# Patient Record
Sex: Female | Born: 2005 | Race: Black or African American | Hispanic: No | Marital: Single | Smoking: Never smoker
Health system: Southern US, Community
[De-identification: ages and names within clinical notes are randomized; demographics above are authoritative.]

## PROBLEM LIST (undated history)

## (undated) DIAGNOSIS — F419 Anxiety disorder, unspecified: Secondary | ICD-10-CM

## (undated) DIAGNOSIS — J45909 Unspecified asthma, uncomplicated: Secondary | ICD-10-CM

## (undated) DIAGNOSIS — D649 Anemia, unspecified: Secondary | ICD-10-CM

## (undated) HISTORY — DX: Unspecified asthma, uncomplicated: J45.909

## (undated) HISTORY — DX: Anxiety disorder, unspecified: F41.9

## (undated) HISTORY — DX: Anemia, unspecified: D64.9

---
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
## 2023-12-18 ENCOUNTER — Telehealth: Payer: Self-pay | Admitting: Pediatrics

## 2023-12-18 NOTE — Telephone Encounter (Signed)
I called patient and got them scheduled with Ricky Stabs. Amy stated that prozac was fine, se will evaluated patient and be able to continue prescription.

## 2023-12-18 NOTE — Telephone Encounter (Signed)
NOTED

## 2023-12-18 NOTE — Telephone Encounter (Signed)
Copied from CRM [id number]. Topic: General - Other >> Dec 18, 2023  8:06 AM Tiffany B wrote: Caller checking on the status of practice admin following up regarding if provider will prescribe prozac and if not she will schedule appt. with behavior health.

## 2024-02-26 ENCOUNTER — Ambulatory Visit: Payer: BC Managed Care – PPO | Admitting: Family Medicine

## 2024-03-01 DIAGNOSIS — M546 Pain in thoracic spine: Secondary | ICD-10-CM | POA: Diagnosis not present

## 2024-03-01 DIAGNOSIS — M545 Low back pain, unspecified: Secondary | ICD-10-CM | POA: Diagnosis not present

## 2024-03-02 ENCOUNTER — Ambulatory Visit (INDEPENDENT_AMBULATORY_CARE_PROVIDER_SITE_OTHER): Payer: BC Managed Care – PPO | Admitting: Family

## 2024-03-02 ENCOUNTER — Encounter: Payer: Self-pay | Admitting: Family

## 2024-03-02 VITALS — BP 112/73 | HR 89 | Temp 98.4°F | Ht 63.5 in | Wt 193.8 lb

## 2024-03-02 DIAGNOSIS — F419 Anxiety disorder, unspecified: Secondary | ICD-10-CM | POA: Diagnosis not present

## 2024-03-02 DIAGNOSIS — Z131 Encounter for screening for diabetes mellitus: Secondary | ICD-10-CM | POA: Diagnosis not present

## 2024-03-02 DIAGNOSIS — Z13220 Encounter for screening for lipoid disorders: Secondary | ICD-10-CM

## 2024-03-02 DIAGNOSIS — J45990 Exercise induced bronchospasm: Secondary | ICD-10-CM | POA: Diagnosis not present

## 2024-03-02 DIAGNOSIS — Z13228 Encounter for screening for other metabolic disorders: Secondary | ICD-10-CM

## 2024-03-02 DIAGNOSIS — Z0000 Encounter for general adult medical examination without abnormal findings: Secondary | ICD-10-CM

## 2024-03-02 DIAGNOSIS — Z130 Encounter for screening for diseases of the blood and blood-forming organs and certain disorders involving the immune mechanism: Secondary | ICD-10-CM | POA: Diagnosis not present

## 2024-03-02 DIAGNOSIS — Z1159 Encounter for screening for other viral diseases: Secondary | ICD-10-CM

## 2024-03-02 DIAGNOSIS — F32A Depression, unspecified: Secondary | ICD-10-CM

## 2024-03-02 DIAGNOSIS — Z114 Encounter for screening for human immunodeficiency virus [HIV]: Secondary | ICD-10-CM | POA: Diagnosis not present

## 2024-03-02 DIAGNOSIS — Z7689 Persons encountering health services in other specified circumstances: Secondary | ICD-10-CM

## 2024-03-02 DIAGNOSIS — Z1329 Encounter for screening for other suspected endocrine disorder: Secondary | ICD-10-CM

## 2024-03-02 MED ORDER — ALBUTEROL SULFATE HFA 108 (90 BASE) MCG/ACT IN AERS: 2.0000 | INHALATION_SPRAY | Freq: Four times a day (QID) | RESPIRATORY_TRACT | 1 refills | Status: AC | PRN

## 2024-03-02 MED ORDER — FLUOXETINE HCL 20 MG PO TABS: 20.0000 mg | ORAL_TABLET | Freq: Every day | ORAL | 1 refills | Status: DC

## 2024-03-02 MED ORDER — FLUOXETINE HCL 10 MG PO TABS: 10.0000 mg | ORAL_TABLET | Freq: Every day | ORAL | 1 refills | Status: DC

## 2024-03-02 NOTE — Progress Notes (Signed)
 Subjective:    Kelli Stevens - 18 y.o. female MRN [id number]  Date of birth: 05-Sep-2006  HPI  Kelli Stevens is to establish care and annual physical exam. She is accompanied by her mother.  Current issues and/or concerns: - Anxiety depression. Doing well on Fluoxetine 20 mg and Fluoxetine 10 mg daily at bedtime, no issues/concerns. Mother states patient needs referral to Psychiatry for evaluation/management. Patient states she is concerned for additional diagnoses. Patient denies thoughts of self-harm, suicidal ideations, homicidal ideations. - Exercise-induced asthma. Doing well on ProAir Albuterol inhaler, no issues/concerns.  - No further issues/concerns for discussion today.  ROS per HPI     Health Maintenance:  Health Maintenance Due  Topic Date Due   HPV VACCINES (1 - 3-dose series) Never done   HIV Screening  Never done   Hepatitis C Screening  Never done   DTaP/Tdap/Td (1 - Tdap) Never done   COVID-19 Vaccine (1 - 2024-25 season) Never done     Past Medical History: There are no active problems to display for this patient.     Social History   reports that she has never smoked. She has never used smokeless tobacco. She reports that she does not drink alcohol.   Family History  family history is not on file.   Medications: reviewed and updated   Objective:   Physical Exam BP 112/73   Pulse 89   Temp 98.4 F (36.9 C) (Oral)   Ht 5' 3.5" (1.613 m)   Wt 193 lb 12.8 oz (87.9 kg)   LMP 03/01/2024 (Exact Date)   SpO2 98%   BMI 33.79 kg/m   Physical Exam HENT:     Head: Normocephalic and atraumatic.     Right Ear: Tympanic membrane, ear canal and external ear normal.     Left Ear: Tympanic membrane, ear canal and external ear normal.     Nose: Nose normal.     Mouth/Throat:     Mouth: Mucous membranes are moist.     Pharynx: Oropharynx is clear.  Eyes:     Extraocular Movements: Extraocular movements intact.     Conjunctiva/sclera: Conjunctivae normal.      Pupils: Pupils are equal, round, and reactive to light.  Neck:     Thyroid: No thyroid mass, thyromegaly or thyroid tenderness.  Cardiovascular:     Rate and Rhythm: Normal rate and regular rhythm.     Pulses: Normal pulses.     Heart sounds: Normal heart sounds.  Pulmonary:     Effort: Pulmonary effort is normal.     Breath sounds: Normal breath sounds.  Chest:     Comments: Patient declined. Abdominal:     General: Bowel sounds are normal.     Palpations: Abdomen is soft.  Genitourinary:    Comments: Patient declined. Musculoskeletal:        General: Normal range of motion.     Right shoulder: Normal.     Left shoulder: Normal.     Right upper arm: Normal.     Left upper arm: Normal.     Right elbow: Normal.     Left elbow: Normal.     Right forearm: Normal.     Left forearm: Normal.     Right wrist: Normal.     Left wrist: Normal.     Right hand: Normal.     Left hand: Normal.     Cervical back: Normal, normal range of motion and neck supple.     Thoracic back: Normal.  Lumbar back: Normal.     Right hip: Normal.     Left hip: Normal.     Right upper leg: Normal.     Left upper leg: Normal.     Right knee: Normal.     Left knee: Normal.     Right lower leg: Normal.     Left lower leg: Normal.     Right ankle: Normal.     Left ankle: Normal.     Right foot: Normal.     Left foot: Normal.  Skin:    General: Skin is warm and dry.     Capillary Refill: Capillary refill takes less than 2 seconds.  Neurological:     General: No focal deficit present.     Mental Status: She is alert and oriented to person, place, and time.  Psychiatric:        Mood and Affect: Mood normal.        Behavior: Behavior normal.       Assessment & Plan:  1. Encounter to establish care (Primary) 2. Annual physical exam - Counseled on 150 minutes of exercise per week as tolerated, healthy eating (including decreased daily intake of saturated fats, cholesterol, added sugars,  sodium), STI prevention, and routine healthcare maintenance.  3. Screening for metabolic disorder - Routine screening.  - CMP14+EGFR  4. Screening for deficiency anemia - Routine screening.  - CBC  5. Diabetes mellitus screening - Routine screening.  - Hemoglobin A1c  6. Screening cholesterol level - Routine screening.  - Lipid panel  7. Thyroid disorder screen - Routine screening.  - TSH  8. Encounter for screening for HIV - Routine screening.  - HIV antibody (with reflex)  9. Need for hepatitis C screening test - Routine screening.  - Hepatitis C Antibody  10. Anxiety and depression - Patient denies thoughts of self-harm, suicidal ideations, homicidal ideations. - Continue Fluoxetine as prescribed. Counseled on medication adherence/adverse effects. - Referral to Psychiatry for evaluation/management.  - Follow-up with primary provider as scheduled. - FLUoxetine (PROZAC) 10 MG tablet; Take 1 tablet (10 mg total) by mouth daily.  Dispense: 30 tablet; Refill: 1 - FLUoxetine (PROZAC) 20 MG tablet; Take 1 tablet (20 mg total) by mouth daily.  Dispense: 30 tablet; Refill: 1 - Ambulatory referral to Psychiatry  11. Exercise-induced asthma - Continue Albuterol inhaler as prescribed. Counseled on medication adherence/adverse effects.  - Follow-up with primary provider as scheduled. - albuterol (VENTOLIN HFA) 108 (90 Base) MCG/ACT inhaler; Inhale 2 puffs into the lungs every 6 (six) hours as needed for wheezing or shortness of breath.  Dispense: 16 g; Refill: 1    Patient was given clear instructions to go to Emergency Department or return to medical center if symptoms don't improve, worsen, or new problems develop.The patient verbalized understanding.  I discussed the assessment and treatment plan with the patient. The patient was provided an opportunity to ask questions and all were answered. The patient agreed with the plan and demonstrated an understanding of the  instructions.   The patient was advised to call back or seek an in-person evaluation if the symptoms worsen or if the condition fails to improve as anticipated.    Ricky Stabs, NP 03/02/2024, 4:01 PM Primary Care at [facility]

## 2024-03-02 NOTE — Progress Notes (Signed)
 Patient wants referral for mental health Dr.

## 2024-03-03 ENCOUNTER — Other Ambulatory Visit: Payer: Self-pay | Admitting: Family

## 2024-03-03 DIAGNOSIS — Z130 Encounter for screening for diseases of the blood and blood-forming organs and certain disorders involving the immune mechanism: Secondary | ICD-10-CM

## 2024-03-03 LAB — CBC
Hematocrit: 41 % (ref 34.0–46.6)
Hemoglobin: 13.6 g/dL (ref 11.1–15.9)
MCH: 28.5 pg (ref 26.6–33.0)
MCHC: 33.2 g/dL (ref 31.5–35.7)
MCV: 86 fL (ref 79–97)
Platelets: 366 10*3/uL (ref 150–450)
RBC: 4.78 x10E6/uL (ref 3.77–5.28)
RDW: 13.4 % (ref 11.7–15.4)
WBC: 13.4 10*3/uL — ABNORMAL HIGH (ref 3.4–10.8)

## 2024-03-03 LAB — CMP14+EGFR
ALT: 15 IU/L (ref 0–32)
AST: 17 IU/L (ref 0–40)
Albumin: 4.5 g/dL (ref 4.0–5.0)
Alkaline Phosphatase: 119 IU/L — ABNORMAL HIGH (ref 42–106)
BUN/Creatinine Ratio: 12 (ref 9–23)
BUN: 10 mg/dL (ref 6–20)
Bilirubin Total: 0.3 mg/dL (ref 0.0–1.2)
CO2: 19 mmol/L — ABNORMAL LOW (ref 20–29)
Calcium: 9.4 mg/dL (ref 8.7–10.2)
Chloride: 103 mmol/L (ref 96–106)
Creatinine, Ser: 0.82 mg/dL (ref 0.57–1.00)
Globulin, Total: 3 g/dL (ref 1.5–4.5)
Glucose: 102 mg/dL — ABNORMAL HIGH (ref 70–99)
Potassium: 3.8 mmol/L (ref 3.5–5.2)
Sodium: 139 mmol/L (ref 134–144)
Total Protein: 7.5 g/dL (ref 6.0–8.5)
eGFR: 106 mL/min/{1.73_m2} (ref >59)

## 2024-03-03 LAB — HEPATITIS C ANTIBODY: Hep C Virus Ab: NONREACTIVE

## 2024-03-03 LAB — LIPID PANEL
Chol/HDL Ratio: 2.5 ratio (ref 0.0–4.4)
Cholesterol, Total: 126 mg/dL (ref 100–169)
HDL: 51 mg/dL (ref >39)
LDL Chol Calc (NIH): 61 mg/dL (ref 0–109)
Triglycerides: 67 mg/dL (ref 0–89)
VLDL Cholesterol Cal: 14 mg/dL (ref 5–40)

## 2024-03-03 LAB — HEMOGLOBIN A1C
Est. average glucose Bld gHb Est-mCnc: 105 mg/dL
Hgb A1c MFr Bld: 5.3 % (ref 4.8–5.6)

## 2024-03-03 LAB — HIV ANTIBODY (ROUTINE TESTING W REFLEX): HIV Screen 4th Generation wRfx: NONREACTIVE

## 2024-03-03 LAB — TSH: TSH: 1.28 u[IU]/mL (ref 0.450–4.500)

## 2024-03-05 DIAGNOSIS — M545 Low back pain, unspecified: Secondary | ICD-10-CM | POA: Diagnosis not present

## 2024-04-01 DIAGNOSIS — M5451 Vertebrogenic low back pain: Secondary | ICD-10-CM | POA: Diagnosis not present

## 2024-04-07 DIAGNOSIS — M5451 Vertebrogenic low back pain: Secondary | ICD-10-CM | POA: Diagnosis not present

## 2024-04-12 ENCOUNTER — Other Ambulatory Visit: Payer: Self-pay | Admitting: Family

## 2024-04-12 ENCOUNTER — Other Ambulatory Visit: Payer: Self-pay

## 2024-04-12 DIAGNOSIS — F32A Depression, unspecified: Secondary | ICD-10-CM

## 2024-04-12 NOTE — Telephone Encounter (Signed)
 Copied from CRM [id number]. Topic: Clinical - Medication Refill >> Apr 12, 2024  4:59 PM Leory Rands wrote: Medication: FLUoxetine  (PROZAC ) 10 MG tablet [[id number]] -requesting 90 day supply  FLUoxetine  (PROZAC ) 20 MG tablet [[id number]]- requesting a 90 day supply  Has the patient contacted their pharmacy? Yes (Agent: If no, request that the patient contact the pharmacy for the refill. If patient does not wish to contact the pharmacy document the reason why and proceed with request.) (Agent: If yes, when and what did the pharmacy advise?)  This is the patient's preferred pharmacy: [id number] - [place] ([place]), [place] - [address] [[id number]]   Is this the correct pharmacy for this prescription? Yes If no, delete pharmacy and type the correct one.   Has the prescription been filled recently? Yes  Is the patient out of the medication? Yes  Has the patient been seen for an appointment in the last year OR does the patient have an upcoming appointment? Yes  Can we respond through MyChart? Yes  Agent: Please be advised that Rx refills may take up to 3 business days. We ask that you follow-up with your pharmacy.

## 2024-04-14 DIAGNOSIS — M5451 Vertebrogenic low back pain: Secondary | ICD-10-CM | POA: Diagnosis not present

## 2024-04-14 MED ORDER — FLUOXETINE HCL 20 MG PO TABS: 20.0000 mg | ORAL_TABLET | Freq: Every day | ORAL | 1 refills | Status: DC

## 2024-04-14 MED ORDER — FLUOXETINE HCL 10 MG PO TABS: 10.0000 mg | ORAL_TABLET | Freq: Every day | ORAL | 1 refills | Status: DC

## 2024-04-14 NOTE — Telephone Encounter (Signed)
 Requested Prescriptions  Pending Prescriptions Disp Refills   FLUoxetine  (PROZAC ) 20 MG tablet 30 tablet 1    Sig: Take 1 tablet (20 mg total) by mouth daily.     Psychiatry:  Antidepressants - SSRI Passed - 04/14/2024 12:58 PM      Passed - Valid encounter within last 6 months    Recent Outpatient Visits           1 month ago Encounter to establish care   [facility], Amy J, NP               FLUoxetine  (PROZAC ) 10 MG tablet 30 tablet 1    Sig: Take 1 tablet (10 mg total) by mouth daily.     Psychiatry:  Antidepressants - SSRI Passed - 04/14/2024 12:58 PM      Passed - Valid encounter within last 6 months    Recent Outpatient Visits           1 month ago Encounter to establish care   [facility], Annalee Barren, NP

## 2024-04-21 DIAGNOSIS — M5451 Vertebrogenic low back pain: Secondary | ICD-10-CM | POA: Diagnosis not present

## 2024-05-01 ENCOUNTER — Ambulatory Visit (HOSPITAL_BASED_OUTPATIENT_CLINIC_OR_DEPARTMENT_OTHER): Payer: Self-pay | Admitting: Psychiatry

## 2024-05-01 ENCOUNTER — Encounter (HOSPITAL_COMMUNITY): Payer: Self-pay | Admitting: Psychiatry

## 2024-05-01 DIAGNOSIS — R41840 Attention and concentration deficit: Secondary | ICD-10-CM | POA: Diagnosis not present

## 2024-05-01 DIAGNOSIS — F411 Generalized anxiety disorder: Secondary | ICD-10-CM | POA: Diagnosis not present

## 2024-05-01 DIAGNOSIS — F32A Depression, unspecified: Secondary | ICD-10-CM | POA: Insufficient documentation

## 2024-05-01 MED ORDER — FLUOXETINE HCL 40 MG PO CAPS: 40.0000 mg | ORAL_CAPSULE | Freq: Every day | ORAL | 0 refills | Status: AC

## 2024-05-01 MED ORDER — HYDROXYZINE HCL 10 MG PO TABS: 10.0000 mg | ORAL_TABLET | Freq: Two times a day (BID) | ORAL | 0 refills | Status: AC | PRN

## 2024-05-01 NOTE — Patient Instructions (Addendum)
 [email or web address]  [email or web address] [facility] [address], [place], [place] [address]   [phone]  [facility] [email or web address] [address], [place], [place] [address]  [phone]  [facility] [email or web address] [address], [place], [place] [address]  [phone]   [facility] - [phone]  [facility] - [phone]  [facility] - [phone] counseling [phone]  [facility] [phone]   [email or web address] this clinician can offer telehealth and has a sliding scale option  [email or web address] this group also offers sliding scale rates and is based out of [facility] below :  [facility] [address] [place], [place] [address]  [phone]    Estela Held [address] [place], [place] [address]  [phone]    [facility] [address] [place], [place] [address]  [phone]   Hydroxyzine Capsules or Tablets What is this medication? HYDROXYZINE (hye DROX i zeen) treats the symptoms of allergies and allergic reactions. It may also be used to treat anxiety or cause drowsiness before a procedure. It works by blocking histamine, a substance released by the body during an allergic reaction. It belongs to a group of medications called antihistamines. This medicine may be used for other purposes; ask your health care provider or pharmacist if you have questions. COMMON BRAND NAME(S): ANX, Atarax, Rezine, Vistaril What should I tell my care team before I take this medication? They need to know if you have any of these conditions: Glaucoma Heart disease Irregular heartbeat or  rhythm Kidney disease Liver disease Lung or breathing disease, such as asthma Stomach or intestine problems Thyroid disease Trouble passing urine An unusual or allergic reaction to hydroxyzine, other medications, foods, dyes or preservatives Pregnant or trying to get pregnant Breastfeeding How should I use this medication? Take this medication by mouth with a full glass of water. Take it as directed on the prescription label at the same time every day. You can take it with or without food. If it upsets your stomach, take it with food. Talk to your care team about the use of this medication in children. While it may be prescribed for children as young as 6 years for selected conditions, precautions do apply. People 65 years and older may have a stronger reaction and need a smaller dose. Overdosage: If you think you have taken too much of this medicine contact a poison control center or emergency room at once. NOTE: This medicine is only for you. Do not share this medicine with others. What if I miss a dose? If you miss a dose, take it as soon as you can. If it is almost time for your next dose, take only that dose. Do not take double or extra doses. What may interact with this medication? Do not take this medication with any of the following: Cisapride Dronedarone Pimozide Thioridazine This medication may also interact with the following: Alcohol Antihistamines for allergy, cough, and cold Atropine Barbiturate medications for sleep or seizures, such as phenobarbital Certain antibiotics, such as erythromycin or clarithromycin Certain medications for anxiety or sleep Certain medications for bladder problems, such as oxybutynin or tolterodine Certain medications for irregular heartbeat Certain medications for mental health conditions Certain medications for Parkinson disease, such as benztropine, trihexyphenidyl Certain medications for seizures, such as phenobarbital or  primidone Certain medications for stomach problems, such as dicyclomine or hyoscyamine Certain medications for travel sickness, such as scopolamine Ipratropium Opioid medications for pain Other medications that cause heart rhythm changes, such as dofetilide This list may not describe all possible interactions. Give your health care provider a list of all the medicines, herbs, non-prescription drugs, or dietary supplements you use. Also tell them if you smoke, drink alcohol, or use illegal drugs. Some items may interact with your medicine. What should I watch for while using this medication? Visit your care team for regular checks on your progress. Tell your care team if your symptoms do not start to get better or if they get worse. This medication may affect your coordination, reaction time, or judgment. Do not drive or operate machinery until you know how this medication affects you. Sit up or stand slowly to reduce the risk of dizzy or fainting spells. Drinking alcohol with this medication can increase the risk of these side effects. Your mouth may get dry. Chewing sugarless gum or sucking hard candy and drinking plenty of water may help. Contact your care team if the problem does not go away or is severe. This medication may cause dry eyes and blurred vision. If you wear contact lenses, you may feel some discomfort. Lubricating eye drops may help. See your care team if the problem does not go away or is severe. If you are receiving skin tests for allergies, tell your care team you are taking this medication. What side effects may I notice from receiving this medication? Side effects that you should report to your care team as soon as possible: Allergic reactions--skin rash, itching, hives, swelling of the face, lips, tongue, or throat Heart rhythm changes--fast or irregular heartbeat, dizziness, feeling faint or lightheaded, chest pain, trouble breathing Side effects that usually do not require  medical attention (report to your care team if they continue or are bothersome): Confusion Drowsiness Dry mouth Hallucinations Headache This list may not describe all possible side effects. Call your doctor for medical advice about side effects. You may report side effects to FDA at 1-800-FDA-1088. Where should I keep my medication? Keep out of the reach of children and pets. Store at room temperature between 15 and 30 degrees C (59 and 86 degrees F). Keep container tightly closed. Throw away any unused medication after the expiration date. NOTE: This sheet is a summary. It may not cover all possible information. If you have questions about this medicine, talk to your doctor, pharmacist, or health care provider.  2024 Elsevier/Gold Standard (2022-06-28 00:00:00)  Fluoxetine  Capsules or Tablets (Depression/Mood Disorders) What is this medication? FLUOXETINE  (floo OX e teen) treats depression, anxiety, obsessive-compulsive disorder (OCD), and eating disorders. It increases the amount of serotonin in the brain, a hormone that helps regulate mood. It belongs to a group of medications called SSRIs. This medicine may be used for other purposes; ask your health care provider or pharmacist if you have questions. COMMON BRAND NAME(S): Prozac  What should I tell my care team before I take this medication? They need to know if you have any of these conditions: Bipolar disorder or a family history of bipolar disorder Bleeding disorder Glaucoma Heart disease Liver disease Low levels of sodium in the blood Seizures Suicidal thoughts, plans, or attempt by you or a family member Take an MAOI, such as Carbex, Eldepryl, Marplan, Nardil, or Parnate Take medications that treat or prevent blood clots Thyroid disease An unusual or allergic reaction to fluoxetine , other medications,  foods, dyes, or preservatives Pregnant or trying to get pregnant Breastfeeding How should I use this medication? Take this  medication by mouth with a glass of water. Follow the directions on the prescription label. You can take this medication with or without food. Take your medication at regular intervals. Do not take it more often than directed. Do not stop taking this medication suddenly except upon the advice of your care team. Stopping this medication too quickly may cause serious side effects or your condition may worsen. A special MedGuide will be given to you by the pharmacist with each prescription and refill. Be sure to read this information carefully each time. Talk to your care team about the use of this medication in children. While it may be prescribed for children as young as 7 years for selected conditions, precautions do apply. Overdosage: If you think you have taken too much of this medicine contact a poison control center or emergency room at once. NOTE: This medicine is only for you. Do not share this medicine with others. What if I miss a dose? If you miss a dose, skip the missed dose and go back to your regular dosing schedule. Do not take double or extra doses. What may interact with this medication? Do not take this medication with any of the following: Other medications containing fluoxetine , such as Sarafem  or Symbyax Cisapride Dronedarone Linezolid MAOIs, such as Carbex, Eldepryl, Marplan, Nardil, and Parnate Methylene blue (injected into a vein) Pimozide Thioridazine This medication may also interact with the following: Alcohol Amphetamines Aspirin and aspirin-like medications Carbamazepine Certain medications for mental health conditions Certain medications for migraine headache, such as almotriptan, eletriptan, frovatriptan, naratriptan, rizatriptan, sumatriptan, zolmitriptan Digoxin Diuretics Fentanyl Flecainide Furazolidone Isoniazid Lithium Medications that help you fall asleep Medications that treat or prevent blood clots, such as warfarin, enoxaparin, and  dalteparin NSAIDs, medications for pain and inflammation, such as ibuprofen or naproxen Other medications that cause heart rhythm changes Phenytoin Procarbazine Propafenone Rasagiline Ritonavir Supplements, such as St. John's wort, kava kava, valerian Tramadol Tryptophan Vinblastine This list may not describe all possible interactions. Give your health care provider a list of all the medicines, herbs, non-prescription drugs, or dietary supplements you use. Also tell them if you smoke, drink alcohol, or use illegal drugs. Some items may interact with your medicine. What should I watch for while using this medication? Tell your care team if your symptoms do not get better or if they get worse. Visit your care team for regular checks on your progress. Because it may take several weeks to see the full effects of this medication, it is important to continue your treatment as prescribed. Watch for new or worsening thoughts of suicide or depression. This includes sudden changes in mood, behavior, or thoughts. These changes can happen at any time but are more common in the beginning of treatment or after a change in dose. Call your care team right away if you experience these thoughts or worsening depression. This medication may cause mood and behavior changes, such as anxiety, nervousness, irritability, hostility, restlessness, excitability, hyperactivity, or trouble sleeping. These changes can happen at any time but are more common in the beginning of treatment or after a change in dose. Call your care team right away if you notice any of these symptoms. This medication may affect your coordination, reaction time, or judgment. Do not drive or operate machinery until you know how this medication affects you. Sit up or stand slowly to reduce the risk of  dizzy or fainting spells. Drinking alcohol with this medication can increase the risk of these side effects. Your mouth may get dry. Chewing sugarless gum  or sucking hard candy and drinking plenty of water may help. Contact your care team if the problem does not go away or is severe. This medication may affect blood sugar levels. If you have diabetes, check with your care team before you make changes to your diet or medications. What side effects may I notice from receiving this medication? Side effects that you should report to your care team as soon as possible: Allergic reactions--skin rash, itching, hives, swelling of the face, lips, tongue, or throat Bleeding--bloody or black, tar-like stools, red or dark brown urine, vomiting blood or brown material that looks like coffee grounds, small, red or purple spots on skin, unusual bleeding or bruising Heart rhythm changes--fast or irregular heartbeat, dizziness, feeling faint or lightheaded, chest pain, trouble breathing Loss of appetite with weight loss Low sodium level--muscle weakness, fatigue, dizziness, headache, confusion Serotonin syndrome--irritability, confusion, fast or irregular heartbeat, muscle stiffness, twitching muscles, sweating, high fever, seizure, chills, vomiting, diarrhea Sudden eye pain or change in vision such as blurry vision, seeing halos around lights, vision loss Thoughts of suicide or self-harm, worsening mood, feelings of depression Side effects that usually do not require medical attention (report to your care team if they continue or are bothersome): Anxiety, nervousness Change in sex drive or performance Diarrhea Dry mouth Headache Excessive sweating Nausea Tremors or shaking Trouble sleeping Upset stomach This list may not describe all possible side effects. Call your doctor for medical advice about side effects. You may report side effects to FDA at 1-800-FDA-1088. Where should I keep my medication? Keep out of the reach of children and pets. Store at room temperature between 15 and 30 degrees C (59 and 86 degrees F). Get rid of any unused medication after  the expiration date. NOTE: This sheet is a summary. It may not cover all possible information. If you have questions about this medicine, talk to your doctor, pharmacist, or health care provider.  2024 Elsevier/Gold Standard (2022-09-03 00:00:00)

## 2024-05-01 NOTE — Progress Notes (Signed)
 Virtual Visit via Video Note  I connected with Kelli Stevens on 05/01/24 at 10:00 AM EDT by a video enabled telemedicine application and verified that I am speaking with the correct person using two identifiers.  Location Provider Location : [facility] Patient Location : [facility]  Participants: Patient , Provider    I discussed the limitations of evaluation and management by telemedicine and the availability of in person appointments. The patient expressed understanding and agreed to proceed.   I discussed the assessment and treatment plan with the patient. The patient was provided an opportunity to ask questions and all were answered. The patient agreed with the plan and demonstrated an understanding of the instructions.   The patient was advised to call back or seek an in-person evaluation if the symptoms worsen or if the condition fails to improve as anticipated.    Psychiatric Initial Adult Assessment   Patient Identification: Kelli Stevens MRN:  [id number] Date of Evaluation:  05/01/2024 Referral Source: Lavona Pounds NP  Chief Complaint:   Chief Complaint  Patient presents with   Establish Care   Depression   Anxiety   Medication Refill   Insomnia   Visit Diagnosis:    ICD-10-CM   1. GAD (generalized anxiety disorder)  F41.1 FLUoxetine  (PROZAC ) 40 MG capsule    hydrOXYzine (ATARAX) 10 MG tablet    2. Depression, unspecified depression type  F32.A FLUoxetine  (PROZAC ) 40 MG capsule    3. Attention and concentration deficit  R41.840 Ambulatory referral to Neuropsychology      Discussed the use of AI scribe software for clinical note transcription with the patient, who gave verbal consent to proceed.  History of Present Illness Kelli Stevens is a 18 year old African-American female, currently student at [employer], employed, lives in [place], has a history of, depression, anxiety, exercise-induced asthma presents for psychiatric evaluation  and management. She was referred by her primary provider, Maeola Schmidt, a nurse practitioner, for management of her depression and anxiety.  She experiences symptoms of depression including feeling down, lack of interest, and hopelessness occurring several days in the past two weeks. Sleep disturbances are present, with frequent awakenings at night despite using melatonin.  She uses melatonin 40 mg and that is ineffective.  She has been taking fluoxetine  at bedtime and unknown if that is contributing to sleep problems.  She feels tired more than half the time and experiences fluctuating appetite, sometimes leading to binge eating. This has been ongoing since the past several years.  Has experienced these symptoms more and more recently. Denies suicidal ideation, homicidality.   Anxiety symptoms are experienced daily, characterized by nervousness and excessive worrying. She has difficulty relaxing and becomes easily irritable, making functioning somewhat difficult. A history of self-harm is noted, with the last occurrence a couple of months ago during a severe panic attack. Panic attacks have been occurring since early high school, triggered by confrontation and feelings of failure, and have worsened over time, lasting several hours to a day.  Describes panic symptoms as physical sensations of anxiety, restlessness, nausea, feeling dizzy.  Used to last only for short period previously however now it lasts for several hours to a day.  Coping tools include music and support from her mother and boyfriend during these episodes.  The last time she had a panic attack was few weeks ago.  She has been on Fluoxetine  since before COVID, currently at 30 mg, taken at night. The medication has a calming effect but  does not significantly alleviate her anxiety, and no side effects have been experienced.  Does report episodes of elevated mood occur every few weeks, lasting a day or two, characterized by noticeable mood swings  and fast talking, but they do not significantly impair functioning.  Denies any significant hypomanic or manic symptoms.  Denies any auditory or visual hallucinations.  Paranoia is experienced, especially in crowded places, and she was previously told she might have agoraphobia.  It was challenging previously to go to grocery stores due to her anxiety symptoms.  That has changed in the past few months.  Improvement in managing social anxiety is noted, with the ability to go to the grocery store without significant distress.  Does report struggles with attention and focus problems.  May have been going on all her life however may have worsened since COVID-19 pandemic.  Struggles with attention, focus, concentration, and has inability to sustain on task.  Currently struggles with her grades although she has passed all her classes.  Interested in referral for ADHD testing.  Denies any obsessions or compulsive behaviors.  Denies any eating disorder problems.    Associated Signs/Symptoms: Depression Symptoms:  depressed mood, anhedonia, insomnia, fatigue, feelings of worthlessness/guilt, difficulty concentrating, increased appetite, decreased appetite, (Hypo) Manic Symptoms:  Impulsivity, Labiality of Mood, Anxiety Symptoms:  Excessive Worry, Panic Symptoms, Psychotic Symptoms:  does report feels 'paranoid' in social situations , but likely due to social anxiety PTSD Symptoms: Negative  Past Psychiatric History: Denies inpatient behavioral health admissions.  Denies suicide attempts.  However does report self-injurious behaviors the last one may have been 2 months ago when she felt overwhelmed due to a panic attack and she scratched herself with a box cutter on her thigh.  Usually does it for emotional relief.  She is on SSRI prescribed by primary care provider.  Used to be under the care of a counselor previously.  Previous Psychotropic Medications: Yes Prozac   Substance Abuse History in  the last 12 months:  No.  Consequences of Substance Abuse: Negative  Past Medical History:  Past Medical History:  Diagnosis Date   Anemia    Anxiety    Asthma    History reviewed. No pertinent surgical history.  Family Psychiatric History: As noted below.  Family History:  Family History  Problem Relation Age of Onset   Mental illness Father    Depression Maternal Aunt    Depression Cousin    OCD Cousin     Social History:   Social History   Socioeconomic History   Marital status: Single    Spouse name: Not on file   Number of children: Not on file   Years of education: Not on file   Highest education level: Some college, no degree  Occupational History   Not on file  Tobacco Use   Smoking status: Never   Smokeless tobacco: Never  Vaping Use   Vaping status: Never Used  Substance and Sexual Activity   Alcohol use: Never   Drug use: Not Currently    Comment: may have used CBD in the past   Sexual activity: Not on file  Other Topics Concern   Not on file  Social History Narrative   Not on file   Social Drivers of Health   Financial Resource Strain: Low Risk  (03/02/2024)   Overall Financial Resource Strain (CARDIA)    Difficulty of Paying Living Expenses: Not hard at all  Food Insecurity: Not on file  Transportation Needs: Not on file  Physical Activity: Inactive (03/02/2024)   Exercise Vital Sign    Days of Exercise per Week: 0 days    Minutes of Exercise per Session: 0 min  Stress: No Stress Concern Present (03/02/2024)   Kelli Stevens [employer] Occupational [employer] - Occupational Stress Questionnaire    Feeling of Stress : Only a little  Social Connections: Socially Isolated (03/02/2024)   Social Connection and Isolation Panel [NHANES]    Frequency of Communication with Friends and Family: More than three times a week    Frequency of Social Gatherings with Friends and Family: More than three times a week    Attends Religious Services: Never    Loss adjuster, chartered or Organizations: No    Attends Banker Meetings: Never    Marital Status: Never married    Additional Social History: She lives with both parents in [place] and has a stepsister she rarely sees. She is a Medical laboratory scientific officer at [employer], double majoring in communications and women and gender studies, and works part-time at [employer]. No history of substance abuse is present, though she has used CBD gummies occasionally for sleep.  She believes in God.  She denies legal problems.  Denies access to a gun.    Allergies:  No Known Allergies  Metabolic Disorder Labs: Lab Results  Component Value Date   HGBA1C 5.3 03/02/2024   No results found for: "PROLACTIN" Lab Results  Component Value Date   CHOL 126 03/02/2024   TRIG 67 03/02/2024   HDL 51 03/02/2024   CHOLHDL 2.5 03/02/2024   LDLCALC 61 03/02/2024   Lab Results  Component Value Date   TSH 1.280 03/02/2024    Therapeutic Level Labs: No results found for: "LITHIUM" No results found for: "CBMZ" No results found for: "VALPROATE"  Current Medications: Current Outpatient Medications  Medication Sig Dispense Refill   albuterol  (VENTOLIN  HFA) 108 (90 Base) MCG/ACT inhaler Inhale 2 puffs into the lungs every 6 (six) hours as needed for wheezing or shortness of breath. 16 g 1   FLUoxetine  (PROZAC ) 40 MG capsule Take 1 capsule (40 mg total) by mouth daily. 30 capsule 0   hydrOXYzine (ATARAX) 10 MG tablet Take 1 tablet (10 mg total) by mouth 2 (two) times daily as needed for anxiety. 60 tablet 0   loratadine (CLARITIN) 10 MG tablet Take 10 mg by mouth daily.     meloxicam (MOBIC) 15 MG tablet Take 15 mg by mouth daily as needed.     No current facility-administered medications for this visit.    Musculoskeletal: Strength & Muscle Tone: UTA Gait & Station: Seated Patient leans: N/A  Psychiatric Specialty Exam: Review of Systems  Psychiatric/Behavioral:  Positive for decreased concentration, dysphoric mood and  sleep disturbance. The patient is nervous/anxious.     There were no vitals taken for this visit.There is no height or weight on file to calculate BMI.  General Appearance: Fairly Groomed  Eye Contact:  Fair  Speech:  Normal Rate  Volume:  Normal  Mood:  Anxious, Depressed, and mood swings   Affect:  Tearful  Thought Process:  Goal Directed and Descriptions of Associations: Intact  Orientation:  Full (Time, Place, and Person)  Thought Content:  Logical  Suicidal Thoughts:  No  Homicidal Thoughts:  No  Memory:  Immediate;   Fair Recent;   Fair Remote;   Fair  Judgement:  Fair  Insight:  Fair  Psychomotor Activity:  Normal  Concentration:  Concentration: Fair and Attention Span: Fair  Recall:  Iona Manis of Knowledge:Fair  Language: Fair  Akathisia:  No  Handed:  Right  AIMS (if indicated):  not done  Assets:  Communication Skills Desire for Improvement Housing Social Support Talents/Skills Transportation  ADL's:  Intact  Cognition: WNL  Sleep:  Poor   Screenings: GAD-7    Flowsheet Row [facility]  Total GAD-7 Score 16      PHQ2-9    Flowsheet Row Office Visit from 05/01/2024 in [facility]  PHQ-2 Total Score 2  PHQ-9 Total Score 11      Flowsheet Row Office Visit from 05/01/2024 in [facility]  C-SSRS RISK CATEGORY No Risk       Assessment and Plan:Dericka Elie is a 18 year old African-American female who has a history of depression, anxiety, exercise-induced asthma [facility]  Discussed assessment and plan as noted below.  Assessment & Plan Generalized anxiety disorder-unstable Chronic anxiety with daily symptoms of nervousness, overthinking, and difficulty relaxing, affecting daily functioning. Current fluoxetine  30 mg is inadequate. Emphasized combining medication with  psychotherapy for optimal management. - Increase Fluoxetine  to 40 mg daily, taken in the morning with breakfast. - Start Hydroxyzine 10 mg twice a day as needed for anxiety. - Refer to psychotherapy for cognitive behavioral therapy (CBT). - Provide resources for finding a therapist, including [employer] and Education officer, community. - Discussed to take Fluoxetine  in the morning to address sleep problems. - Could start Melatonin combination medications over-the-counter since current Melatonin by itself not effective.  Depression, unspecified-unstable Intermittent depressive symptoms with periods of low mood and anhedonia. Current fluoxetine  30 mg is insufficient. Discussed risk of increased suicidality with dose increase in young adults. No current suicidal ideation. Emphasized monitoring for mood or behavior changes and contacting emergency services if suicidal thoughts occur. - Increase Fluoxetine  to 40 mg daily, taken in the morning with breakfast. - Monitor for side effects and signs of increased suicidality. - Provide resources to establish care with the therapist.  Attention and concentration deficit-unstable Currently struggles with attention and focus problems affecting her grades. - Ambulatory referral to neuropsychology-Dr. Cheryll Corti.  Patient with cluster B traits including problems with emotional regulation, impulsivity, mood lability, self-injurious behaviors-will need to explore this in future sessions.  Rule out personality disorder.  Patient will benefit from CBT/DBT.  This was discussed with patient.  I have reviewed and discussed labs including TSH dated 03/03/2019 25-1.280-within normal limits.  I have reviewed notes per primary care provider Ms. Rogerio Clay dated 03/02/2024-patient with anxiety, depression on fluoxetine  30 mg.  Patient referred to psychiatry for further management.  Follow-up Follow-up in clinic in 4 to 6 weeks or sooner in person.  Patient placed on a list  for sooner appointment.   Collaboration of Care: Referral or follow-up with counselor/therapist AEB I have referred patient for psychotherapy.  Ambulatory referral to neuropsychologist.  I have reviewed notes per primary care provider as noted above.  Patient/Guardian was advised Release of Information must be obtained prior to any record release in order to collaborate their care with an outside provider. Patient/Guardian was advised if they have not already done so to contact the registration department to sign all necessary forms in order for us  to release information regarding their care.   Consent: Patient/Guardian gives verbal consent for treatment and assignment of benefits for services provided during this visit. Patient/Guardian expressed understanding and agreed to proceed.  This  note was generated in part or whole with voice recognition software. Voice recognition is usually quite accurate but there are transcription errors that can and very often do occur. I apologize for any typographical errors that were not detected and corrected.    Tamakia Porto, MD 5/31/202511:20 AM

## 2024-05-12 ENCOUNTER — Encounter: Payer: Self-pay | Admitting: Psychology

## 2024-06-09 ENCOUNTER — Encounter: Payer: Self-pay | Admitting: Psychology

## 2024-06-09 ENCOUNTER — Encounter: Attending: Psychology | Admitting: Psychology

## 2024-06-09 DIAGNOSIS — F32A Depression, unspecified: Secondary | ICD-10-CM | POA: Diagnosis not present

## 2024-06-09 DIAGNOSIS — R41840 Attention and concentration deficit: Secondary | ICD-10-CM | POA: Insufficient documentation

## 2024-06-09 DIAGNOSIS — F411 Generalized anxiety disorder: Secondary | ICD-10-CM | POA: Insufficient documentation

## 2024-06-09 NOTE — Progress Notes (Signed)
 NEUROPSYCHOLOGICAL EVALUATION Kelli Stevens. [facility]  Physical Medicine and Rehabilitation     Patient: Kelli Stevens  MRN: [id number] DOB: August 30, 2006  Age: 18 y.o. Sex: female  Race/Ethnicity: Black or African American  Years of Education: 14 Handedness: Right  Referring Provider: Eappen, Saramma, Stevens  Provider/Clinical Neuropsychologist: Evalene DOROTHA Riff, PsyD  Date of Service: 06/09/2024 Start Time: 8:30 AM End Time: 10:30 AM  Location of Service:  [facility] . [facility] [address], [place]. [address] [place], [place] [address] Phone: [phone]  Billing Code/Service:            96116/96121 Individuals Present: Patient was seen for 90 minutes by the provider spent in face-to-face clinical interview, discussion of diagnostic impressions and recommendations. The remaining 30 minutes were utilized for documentation and initial record review.  PATIENT CONSENT AND CONFIDENTIALITY The patient's understanding of the reason for referral was intact. Discussed limits of confidentiality including, but not limited to, posting of final evaluation report in the patient's electronic medical record for both the patient and for the referring provider and appropriate medical professionals. Patient was given the opportunity to have their questions answered. The neuropsychological evaluation process was discussed with the patient and they consented to proceed with the evaluation.  Consent for Evaluation and Treatment: Signed: Yes Explanation of Privacy Policies: Signed: Yes Discussion of Confidentiality Limits: Yes  REASON FOR REFERRAL:The patient was referred for neuropsychological evaluation by her psychiatrist, Kelli Stevens, for neuropsychological assessment for ADHD. The patient has a history of generalized anxiety disorder, depression, and difficulties with attention and concentration. Attention and concentration  difficulties are reported to have negative impacts on academic performance.   Upon interview, the patient expressed understanding of the purpose of the evaluation and interest in being evaluated for ADHD.   HISTORY OF PRESENTING CONCERNS: The following is based on information obtained from the patient via face ton face diagnostic clinical interview which also incorporated DIVA-5 (structured diagnostic interview for ADHD in adults). Those symptoms described as currently present are ones endorsed by the patient, supported by follow-up questioning which obtained more detailed descriptions / examples to confirm endorsement validity (consistency with ADHD etiology).    ADHD Symptomology - Adult (current, present for at least six months):   Attention Deficit Sx Present (3 of 9): (frequent difficulties with) difficulty with sustained attention; difficulties organizing tasks and activities; forgetful in daily activities. Attention Deficit Sx Not Present: careless mistakes/attention to detail errors; not attending when addressed directly; difficulty following instructions/completing tasks; aversion to tasks requiring sustained mental effort (when present, is limited to school); losing things necessary for tasks and activities; easily distracted by extraneous stimuli.  Hyperactivity/Impulsivity Sx Endorsed (2 of 9): (frequent difficulties with) fidgeting/difficulty sitting still; restlessness.  Hyperactivity/Impulsivity Sx Not Present: (frequent difficulties with) significant difficulty or stress remaining seated/needing to move around frequently; difficulty engaging in activities quietly; excessive energy; excessive talking; speaking without thinking/impulsively; difficulty waiting turn; difficulty intruding inappropriately on others.  ADHD Symptomology - Childhood (prior to age 83 years):  Attention Deficit Sx Present (0 of 9): None Attention Deficit Sx Not Present: difficulty with sustained attention;  difficulties organizing tasks and activities; forgetful in daily activities; careless mistakes/attention to detail errors; not attending when addressed directly; difficulty following instructions/completing tasks; aversion to tasks requiring sustained mental effort (when present, is limited to school); losing things necessary for tasks and activities; easily distracted by extraneous stimuli.  Hyperactivity/Impulsivity Sx Endorsed (1 of 9): fidgeting/difficulty sitting still;  Hyperactivity/Impulsivity Sx Not Present: restlessness. significant difficulty or stress remaining seated/needing to move around frequently; difficulty engaging in activities quietly; excessive energy; excessive talking; speaking without thinking/impulsively; difficulty waiting turn; difficulty intruding inappropriately on others.  Note: Those areas of difficulty described by the patient at present were reported to have worsened with social isolation during COVID pandemic. Hyperactivity/impulsivity symptom descriptions by the patient appear likely to be secondary to anxiety related symptoms. Characteristics of descriptions involving attention related symptoms suggested anxiety related interference; difficulty with sustained attention appeared more based on internal distraction and linked to thinking about future tasks/obligations and with a ruminative quality. There is some forgetfulness with respect to future tasks and obligations and but not clearly inconsistent with increased life demands.   Emotional and Behavioral Functioning: The patient denied any significant changes in her mental health since her last visit with Psychiatry at the end of March 2025. Currently, she described her anxiety as pretty bad overall. She reported that her mood was variable / unstable. She denied any active SI or HI and denied engaging in any self-injury behaviors since the last instance reported to Kelli Stevens (~early 2025). She continues to take Prozac   and switched time of day she takes it per psychiatry recommendations. She indicated she continues to struggle with sleep onset due to rumination and from being on her phone. The patient described being in individual therapy during COVID, but indicated she did not feel it was very helpful. When discussion some perseverative tendencies, specifically fixation with particular people, she indicated that she wonders if she may have a particular personality disorder.   Appetite: Variable/fluctuating. Caffeine: Minimal  Alcohol Use: Denies Tobacco Use: Denies Recreational Substance Use: Denies   Level of Functional Independence: The patient is intact with basic and instrumental activities of daily living.   Medical History/Record Review: Per records  History of traumatic brain injury/concussion: Concussion 2022. Denied residual / persisting deficits.    Experience of frequent headaches/migraines: Daily headaches.     Past Medical History:  Diagnosis Date   Anemia    Anxiety    Asthma    Patient Active Problem List   Diagnosis Date Noted   GAD (generalized anxiety disorder) 05/01/2024   Depression 05/01/2024   Attention and concentration deficit 05/01/2024   Family Neurologic/Medical Hx:  Family History  Problem Relation Age of Onset   Mental illness Father    Depression Maternal Aunt    Depression Cousin    OCD Cousin    Medications: Per records, albuterol  (VENTOLIN  HFA) 108 (90 Base) MCG/ACT inhaler FLUoxetine  (PROZAC ) 40 MG capsule (Expired) hydrOXYzine  (ATARAX ) 10 MG tablet loratadine (CLARITIN) 10 MG tablet meloxicam (MOBIC) 15 MG tablet   Academic/Vocational History: Highest level of educational attainment: Currently in Junior year of college.  History of developmental delay:No History of grade repetition:No Enrollment in special education courses:No History of LD: None. Performed well in school growing up. Described strong abilities specifically with memorization. She  indicated that she primarily utilized repetition to accomplish this.   Employment/School: Takes 15 credit hour semesters. Double major in communication and woman and gender studies. 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 She indicated she was working 25-30 hours previously, and suspects she will reduce the total hours per week with the fall semester.   Psychosocial: Lives with parents. Enjoys spending time with friends, watching television, playing games, going dancing.   Mental Status/Behavioral Observations: The patient was seen on an outpatient basis in the  [facility] office for the clinical interview unaccompanied Sensorium/Arousal: Hearing and vision appeared grossly intact. Patient was alert.  Orientation: Full Appearance: Appropriate dress and hygiene. Behavior: Attentive, engaged.  Speech/Language: Expressive language was prosodic, fluent, and well-articulated. Rate and volume was normal. Receptive speech appeared grossly intact based on behavioral observations.  Motor: Within normal limits.  Social Comportment: Appropriate for the setting.   Mood/Affect: Euthymic. Appeared congruent.  Thought Process/Content: Coherent, linear, goal directed.  Ability to Participate in Interview: Readily answered all questions regarding personal history with adequate detail. Insight: Good.    SUMMARY / CLINICAL IMPRESSIONS The patient was referred for neuropsychological evaluation by her psychiatrist, Kelli Stevens, for neuropsychological assessment for ADHD. The patient has a history of generalized anxiety disorder, depression, and difficulties with attention and concentration. Attention and concentration difficulties are reported to have negative impacts on academic performance. Upon interview, the patient expressed understanding of the purpose of the evaluation and interest in being evaluated for ADHD.   In-depth diagnostic clinical interview assessed  for current and past ADHD symptomology. At present, the patient effectively meets symptom criteria for only 3 inattentive symptoms and 2 hyperactivity/impulsivity symptoms, below the required threshold for total symptoms within either area. There were very limited indications of ADHD symptom presence prior to the age of 21 and those that were described did not appear generalized significantly. The combination of minimal ADHD symptomology at present and (even with recognition of limits in retrospective reporting) no compelling evidence of symptoms during childhood, along with descriptions suggestive of contributions from anxiety in current symptoms, significantly reduce concern for neurodevelopmental / ADHD based etiology underlying her difficulties.   The provider discussed his impressions with the patient and she expressed understanding of the information provided. The difficulties with mood and anxiety are likely contributing to her reported struggles, and changes in environmental demands (full time school and nearly full time work during the school year) along with learning to manage increases in demands associated with transitioning into early adulthood are likely also contributing to the difficulties. The provider provided brief psychoeducation around sleep hygiene, particularly light exposure, as the patient reported some struggles in this regard. The provider also discussed the patients thoughts and feelings around engaging in therapy. She expressed ambivalence around engaging in therapy at present both because of limited prior benefit but also limited time in her schedule. The writer feels therapy would likely be beneficial, and discussed with the patient that finding the right match in terms of a therapist is sometimes key. Essentially, it can sometimes take time to find the right therapist, but once you do, it is quite valuable. The patient appeared receptive to this perspective.   Given the outcome  of the clinical interview, a formal neurocognitive evaluation would not be beneficial with respect to ADHD diagnosis as findings would not supersede negative findings from clinical interview given the relative clarity of interview data in this case. The provider discussed this with the patient and she was amenable to forgoing testing based on the provider's recommendations.   Diagnosis: Generalized Anxiety Disorder (per history) Depression, unspecified (per history   Recommendations: Follow-up with the referring provider as originally planned.  Continue to engage with mental health providers as anxiety is likely playing some role attention difficulties.  Continue developing and utilizing strategies to manage multiple obligations and commitments given the demands of school and working (and social life). Be cautious of number of hours worked during semesters as it can be difficult to balance and working too  much can leave you cognitively exhausted / make it difficulty to focus on school work (or even feel motivated to do the work).  Please feel free to reach out with any questions or concerns.                Kelli DOROTHA Riff, PsyD             Neuropsychologist    This report was generated using voice recognition software. While this document has been carefully reviewed, transcription errors may be present. I apologize in advance for any inconvenience. Please contact me if further clarification is needed.

## 2024-06-16 ENCOUNTER — Ambulatory Visit: Admitting: Psychiatry
